# Patient Record
Sex: Female | Born: 1965 | Race: Black or African American | Hispanic: No | Marital: Married | Smoking: Never smoker
Health system: Southern US, Community
[De-identification: ages and names within clinical notes are randomized; demographics above are authoritative.]

## PROBLEM LIST (undated history)

## (undated) DIAGNOSIS — K219 Gastro-esophageal reflux disease without esophagitis: Secondary | ICD-10-CM

## (undated) DIAGNOSIS — I10 Essential (primary) hypertension: Secondary | ICD-10-CM

## (undated) DIAGNOSIS — Z87442 Personal history of urinary calculi: Secondary | ICD-10-CM

## (undated) HISTORY — PX: CYSTOSCOPY W/ STONE MANIPULATION: SHX1427

## (undated) HISTORY — PX: ANKLE FRACTURE SURGERY: SHX122

---

## 2018-02-13 ENCOUNTER — Other Ambulatory Visit: Payer: Self-pay

## 2018-02-13 ENCOUNTER — Encounter: Payer: Self-pay | Admitting: *Deleted

## 2018-02-13 ENCOUNTER — Emergency Department (INDEPENDENT_AMBULATORY_CARE_PROVIDER_SITE_OTHER)
Admission: EM | Admit: 2018-02-13 | Discharge: 2018-02-13 | Disposition: A | Discharge status: Home / Self Care | Payer: BLUE CROSS/BLUE SHIELD | Source: Home / Self Care | Attending: Family Medicine | Admitting: Family Medicine

## 2018-02-13 ENCOUNTER — Emergency Department (INDEPENDENT_AMBULATORY_CARE_PROVIDER_SITE_OTHER): Payer: Worker's Compensation

## 2018-02-13 DIAGNOSIS — M19071 Primary osteoarthritis, right ankle and foot: Secondary | ICD-10-CM

## 2018-02-13 DIAGNOSIS — S93401A Sprain of unspecified ligament of right ankle, initial encounter: Secondary | ICD-10-CM

## 2018-02-13 HISTORY — DX: Gastro-esophageal reflux disease without esophagitis: K21.9

## 2018-02-13 HISTORY — DX: Essential (primary) hypertension: I10

## 2018-02-13 MED ORDER — HYDROCODONE-ACETAMINOPHEN 5-325 MG PO TABS: 1.0000 | ORAL_TABLET | Freq: Four times a day (QID) | ORAL | 0 refills | Status: AC | PRN

## 2018-02-13 NOTE — Discharge Instructions (Addendum)
Apply ice pack for 30 minutes every 1 to 2 hours today and tomorrow.  Elevate.  Use crutches for 3 to 5 days.  Wear Ace wrap until swelling decreases.  Wear brace for about 2 to 3 weeks.  Begin range of motion and stretching exercises in about 5 days as per instruction sheet.  May take Ibuprofen 200mg, 4 tabs every 8 hours with food.  ° °

## 2018-02-13 NOTE — ED Triage Notes (Signed)
Pt c/o RT ankle pain x 0415 this morning after twisting it when she was getting out of the bed. She had a surgery on that in October 2017 for a workers comp injury. She took Advil, elevated and iced today.

## 2018-02-13 NOTE — ED Provider Notes (Signed)
[facility]    CSN: [id number] Arrival date & time: 02/13/18  1747     History   Chief Complaint Chief Complaint  Patient presents with  . Ankle Pain    HPI Sheila Zamora is a 52 y.o. female.   Patient states that she became briefly light-headed this morning, and twisted (inverted) her right ankle.  She has had persistent pain/swelling in her right ankle.  She has a past history of right ankle fracture in 2017 requiring ORIF.  The history is provided by the patient.  Ankle Pain  Location:  Ankle Time since incident:  14 hours Injury: yes   Mechanism of injury comment:  Inverted ankle Ankle location:  R ankle Pain details:    Quality:  Aching   Radiates to:  Does not radiate   Severity:  Moderate   Onset quality:  Sudden   Timing:  Constant   Progression:  Unchanged Chronicity:  New Prior injury to area:  Yes Relieved by:  Nothing Worsened by:  Bearing weight Ineffective treatments:  NSAIDs and ice Associated symptoms: decreased ROM, stiffness and swelling   Associated symptoms: no muscle weakness, no numbness and no tingling     Past Medical History:  Diagnosis Date  . GERD (gastroesophageal reflux disease)   . Hypertension     There are no active problems to display for this patient.   Past Surgical History:  Procedure Laterality Date  . ANKLE FRACTURE SURGERY Right     OB History   None      Home Medications    Prior to Admission medications   Medication Sig Start Date End Date Taking? Authorizing Provider  lisinopril (PRINIVIL,ZESTRIL) 30 MG tablet Take by mouth. 05/16/14  Yes [provider]  HYDROcodone-acetaminophen (NORCO/VICODIN) 5-325 MG tablet Take 1 tablet by mouth every 6 (six) hours as needed for up to 5 days for moderate pain. 02/13/18 02/18/18  Lattie Haw, MD  pantoprazole (PROTONIX) 40 MG tablet TAKE 1 TABLET (40 MG TOTAL) BY MOUTH DAILY. 01/23/18   [provider]    Family History Family History    Problem Relation Age of Onset  . Cancer Mother        ovarian  . Heart attack Father   . Stroke Father     Social History Social History   Tobacco Use  . Smoking status: Never Smoker  . Smokeless tobacco: Never Used  Substance Use Topics  . Alcohol use: Never    Frequency: Never  . Drug use: Never     Allergies   Patient has no known allergies.   Review of Systems Review of Systems  Musculoskeletal: Positive for stiffness.  All other systems reviewed and are negative.    Physical Exam Triage Vital Signs ED Triage Vitals  Enc Vitals Group     BP 02/13/18 1839 132/85     Pulse Rate 02/13/18 1839 74     Resp 02/13/18 1839 16     Temp 02/13/18 1839 97.6 F (36.4 C)     Temp Source 02/13/18 1839 Oral     SpO2 02/13/18 1839 100 %     Weight 02/13/18 1840 212 lb (96.2 kg)     Height 02/13/18 1840  (1.702 m)     Head Circumference --      Peak Flow --      Pain Score 02/13/18 1839 6     Pain Loc --      Pain Edu? --  Excl. in GC? --    No data found.  Updated Vital Signs BP 132/85 (BP Location: Right Arm)   Pulse 74   Temp 97.6 F (36.4 C) (Oral)   Resp 16   Ht  (1.702 m)   Wt 212 lb (96.2 kg)   SpO2 100%   BMI 33.20 kg/m   Visual Acuity Right Eye Distance:   Left Eye Distance:   Bilateral Distance:    Right Eye Near:   Left Eye Near:    Bilateral Near:     Physical Exam  Constitutional: She appears well-developed and well-nourished. No distress.  HENT:  Head: Atraumatic.  Eyes: Pupils are equal, round, and reactive to light.  Neck: Normal range of motion.  Cardiovascular: Normal rate.  Pulmonary/Chest: Effort normal.  Musculoskeletal:       Right ankle: She exhibits decreased range of motion and swelling. She exhibits no ecchymosis, no deformity, no laceration and normal pulse. Tenderness. Lateral malleolus tenderness found. No head of 5th metatarsal tenderness found. Achilles tendon normal.       Feet:  Right ankle:   Decreased range of motion.  Tenderness and swelling over the lateral malleolus and peroneal tendon.  Joint stable.  No tenderness over the base of the fifth metatarsal.  Distal neurovascular function is intact.   Neurological: She is alert.  Skin: Skin is warm and dry.  Nursing note and vitals reviewed.    UC Treatments / Results  Labs (all labs ordered are listed, but only abnormal results are displayed) Labs Reviewed - No data to display  EKG None  Radiology Dg Ankle Complete Right  Result Date: 02/13/2018 CLINICAL DATA:  52 year old female with right ankle pain since twisting her ankle while getting out of bed. EXAM: RIGHT ANKLE - COMPLETE 3+ VIEW COMPARISON:  None. FINDINGS: Mild soft tissue swelling about the ankle. Surgical changes of prior ORIF of a now healed fibular fracture with buttress plate and screw construct as well as ORIF of a posterior distal tibia fracture with a small malleable plate and screw construct. 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 No evidence of acute fracture, malalignment or hardware complication. IMPRESSION: 1. Mild soft tissue swelling surrounding the ankle without evidence of acute fracture, malalignment or hardware complication. 2. 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 3. Surgical changes of prior ORIF of healed distal fibular and distal posterior tibial fractures. Electronically Signed   By: Malachy Moan M.D.   On: 02/13/2018 19:05    Procedures Procedures (including critical care time)  Medications Ordered in UC Medications - No data to display  Initial Impression / Assessment and Plan / UC Course  I have reviewed the triage vital signs and the nursing notes.  Pertinent labs & imaging results that were available during my care of the patient were reviewed by me and considered in my medical decision making (see chart for details).    Ace wrap applied.  Dispensed AirCast stirrup  splint. Rx for Lortab. Controlled Substance Prescriptions I have consulted the [place] Controlled Substances Registry for this patient, and feel the risk/benefit ratio today is favorable for proceeding with this prescription for a controlled substance.  Because of her history of ORIF, recommend followup with orthopedist 7 to 10 days.  Final Clinical Impressions(s) / UC Diagnoses   Final diagnoses:  Sprain of right ankle, unspecified ligament, initial encounter     Discharge Instructions     Apply ice pack for 30 minutes every 1 to  2 hours today and tomorrow.  Elevate.  Use crutches for 3 to 5 days.  Wear Ace wrap until swelling decreases.  Wear brace for about 2 to 3 weeks.  Begin range of motion and stretching exercises in about 5 days as per instruction sheet.  May take Ibuprofen , 4 tabs every 8 hours with food.     ED Prescriptions    Medication Sig Dispense Auth. Provider   HYDROcodone-acetaminophen (NORCO/VICODIN) 5-325 MG tablet Take 1 tablet by mouth every 6 (six) hours as needed for up to 5 days for moderate pain. 12 tablet Lattie Haw, MD        Lattie Haw, MD 02/15/18 1044

## 2018-04-28 ENCOUNTER — Emergency Department (INDEPENDENT_AMBULATORY_CARE_PROVIDER_SITE_OTHER)
Admission: EM | Admit: 2018-04-28 | Discharge: 2018-04-28 | Disposition: A | Discharge status: Home / Self Care | Payer: BLUE CROSS/BLUE SHIELD | Source: Home / Self Care | Attending: Family Medicine | Admitting: Family Medicine

## 2018-04-28 ENCOUNTER — Other Ambulatory Visit: Payer: Self-pay

## 2018-04-28 DIAGNOSIS — H6123 Impacted cerumen, bilateral: Secondary | ICD-10-CM | POA: Diagnosis not present

## 2018-04-28 NOTE — ED Provider Notes (Signed)
[facility]    CSN: [id number] Arrival date & time: 04/28/18  1634     History   Chief Complaint Chief Complaint  Patient presents with  . Ear Fullness    HPI Sheila Zamora is a 52 y.o. female.   Patient reports that her ears have felt full for about a week, worse on the right.  She denies pain.  The history is provided by the patient.    Past Medical History:  Diagnosis Date  . GERD (gastroesophageal reflux disease)   . Hypertension     There are no active problems to display for this patient.   Past Surgical History:  Procedure Laterality Date  . ANKLE FRACTURE SURGERY Right     OB History   None      Home Medications    Prior to Admission medications   Medication Sig Start Date End Date Taking? Authorizing Provider  lisinopril (PRINIVIL,ZESTRIL) 30 MG tablet Take by mouth. 05/16/14   [provider]  pantoprazole (PROTONIX) 40 MG tablet TAKE 1 TABLET (40 MG TOTAL) BY MOUTH DAILY. 01/23/18   [provider]    Family History Family History  Problem Relation Age of Onset  . Cancer Mother        ovarian  . Heart attack Father   . Stroke Father     Social History Social History   Tobacco Use  . Smoking status: Never Smoker  . Smokeless tobacco: Never Used  Substance Use Topics  . Alcohol use: Never    Frequency: Never  . Drug use: Never     Allergies   Patient has no known allergies.   Review of Systems Review of Systems No sore throat No cough No pleuritic pain No wheezing No nasal congestion No post-nasal drainage No sinus pain/pressure No itchy/red eyes ? earache No hemoptysis No SOB No fever/chills No nausea No vomiting No abdominal pain No diarrhea No urinary symptoms No skin rash No fatigue No myalgias No headache    Physical Exam Triage Vital Signs ED Triage Vitals [04/28/18 1707]  Enc Vitals Group     BP (!) 145/82     Pulse Rate 83     Resp      Temp 98.6 F (37 C)     Temp  Source Oral     SpO2 100 %     Weight 208 lb (94.3 kg)     Height 5\' 7"  (1.702 m)     Head Circumference      Peak Flow      Pain Score 0     Pain Loc      Pain Edu?      Excl. in GC?    No data found.  Updated Vital Signs BP (!) 145/82 (BP Location: Right Arm)   Pulse 83   Temp 98.6 F (37 C) (Oral)   Ht 5\' 7"  (1.702 m)   Wt 208 lb (94.3 kg)   SpO2 100%   BMI 32.58 kg/m   Visual Acuity Right Eye Distance:   Left Eye Distance:   Bilateral Distance:    Right Eye Near:   Left Eye Near:    Bilateral Near:     Physical Exam Nursing notes and Vital Signs reviewed. Appearance:  Patient appears stated age, and in no acute distress Eyes:  Pupils are equal, round, and reactive to light and accomodation.  Extraocular movement is intact.  Conjunctivae are not inflamed  Ears:   Canals occluded with cerumen  bilaterally.  Post lavage, small amount cerumen remains in canals but tympanic membranes appear normal.    UC Treatments / Results  Labs (all labs ordered are listed, but only abnormal results are displayed) Labs Reviewed - No data to display  EKG None  Radiology No results found.  Procedures Procedures  Ear lavage by nurse  Medications Ordered in UC Medications - No data to display  Initial Impression / Assessment and Plan / UC Course  I have reviewed the triage vital signs and the nursing notes.  Pertinent labs & imaging results that were available during my care of the patient were reviewed by me and considered in my medical decision making (see chart for details).       Final Clinical Impressions(s) / UC Diagnoses   Final diagnoses:  Bilateral impacted cerumen     Discharge Instructions      To prevent recurrent ear wax blockage, try the following: Soak two cotton balls with mineral oil, and gently place in each ear canal once weekly.  Leave the cotton balls in place for 10 to 20 minutes.  This will help liquefy the ear wax and aid your body's  normal elimination process.  If applicable, do not use a hearing aid for 8 hours overnight.  Have your ears cleaned by a health professional every 6 to 12 months.  Avoid using "Q-tips" and ear wax softening solutions.     ED Prescriptions    None        Lattie HawBeese, Cayley Pester A, MD 04/28/18 1734

## 2018-04-28 NOTE — Discharge Instructions (Signed)
To prevent recurrent ear wax blockage, try the following: Soak two cotton balls with mineral oil, and gently place in each ear canal once weekly.  Leave the cotton balls in place for 10 to 20 minutes.  This will help liquefy the ear wax and aid your body's normal elimination process.  If applicable, do not use a hearing aid for 8 hours overnight.  Have your ears cleaned by a health professional every 6 to 12 months.  Avoid using "Q-tips" and ear wax softening solutions  

## 2018-04-28 NOTE — ED Triage Notes (Signed)
Pt stated that her ears have felt full all week, and hearing has decreased in the right.

## 2018-12-14 IMAGING — DX DG ANKLE COMPLETE 3+V*R*
3 series · 3 of 3 positions shown · non-contrast
Comparison: None.

CLINICAL DATA: 51-year-old female with right ankle pain since
twisting her ankle while getting out of bed.

EXAM:
RIGHT ANKLE - COMPLETE 3+ VIEW

[ankle ap]
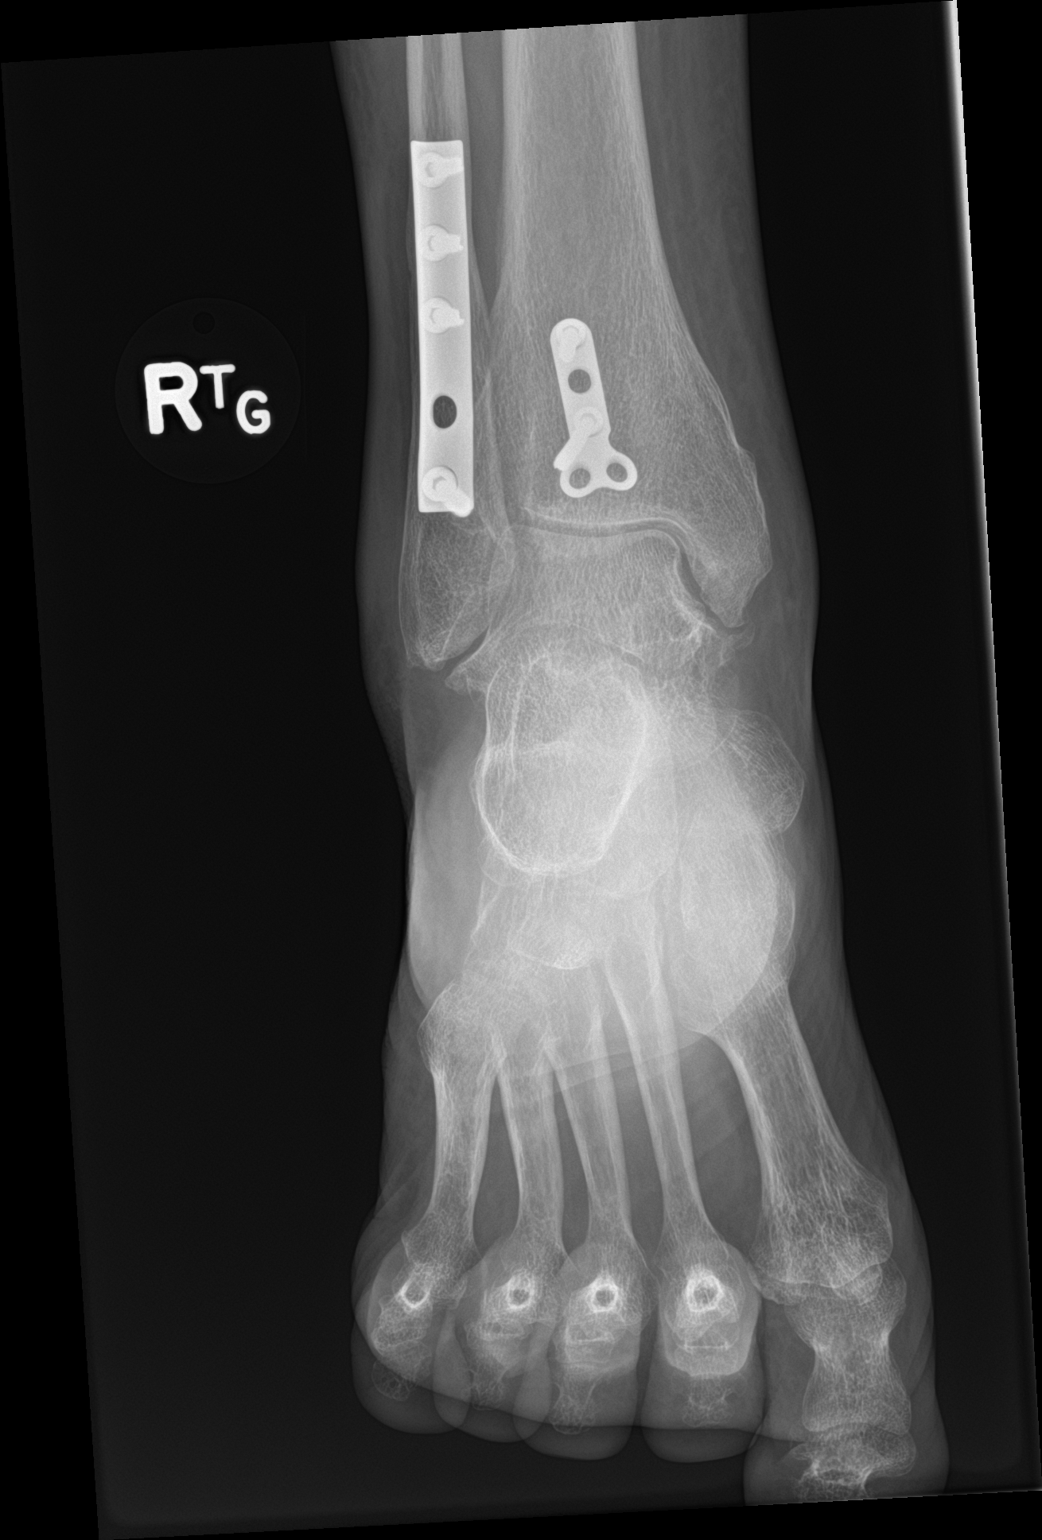

[ankle obl]
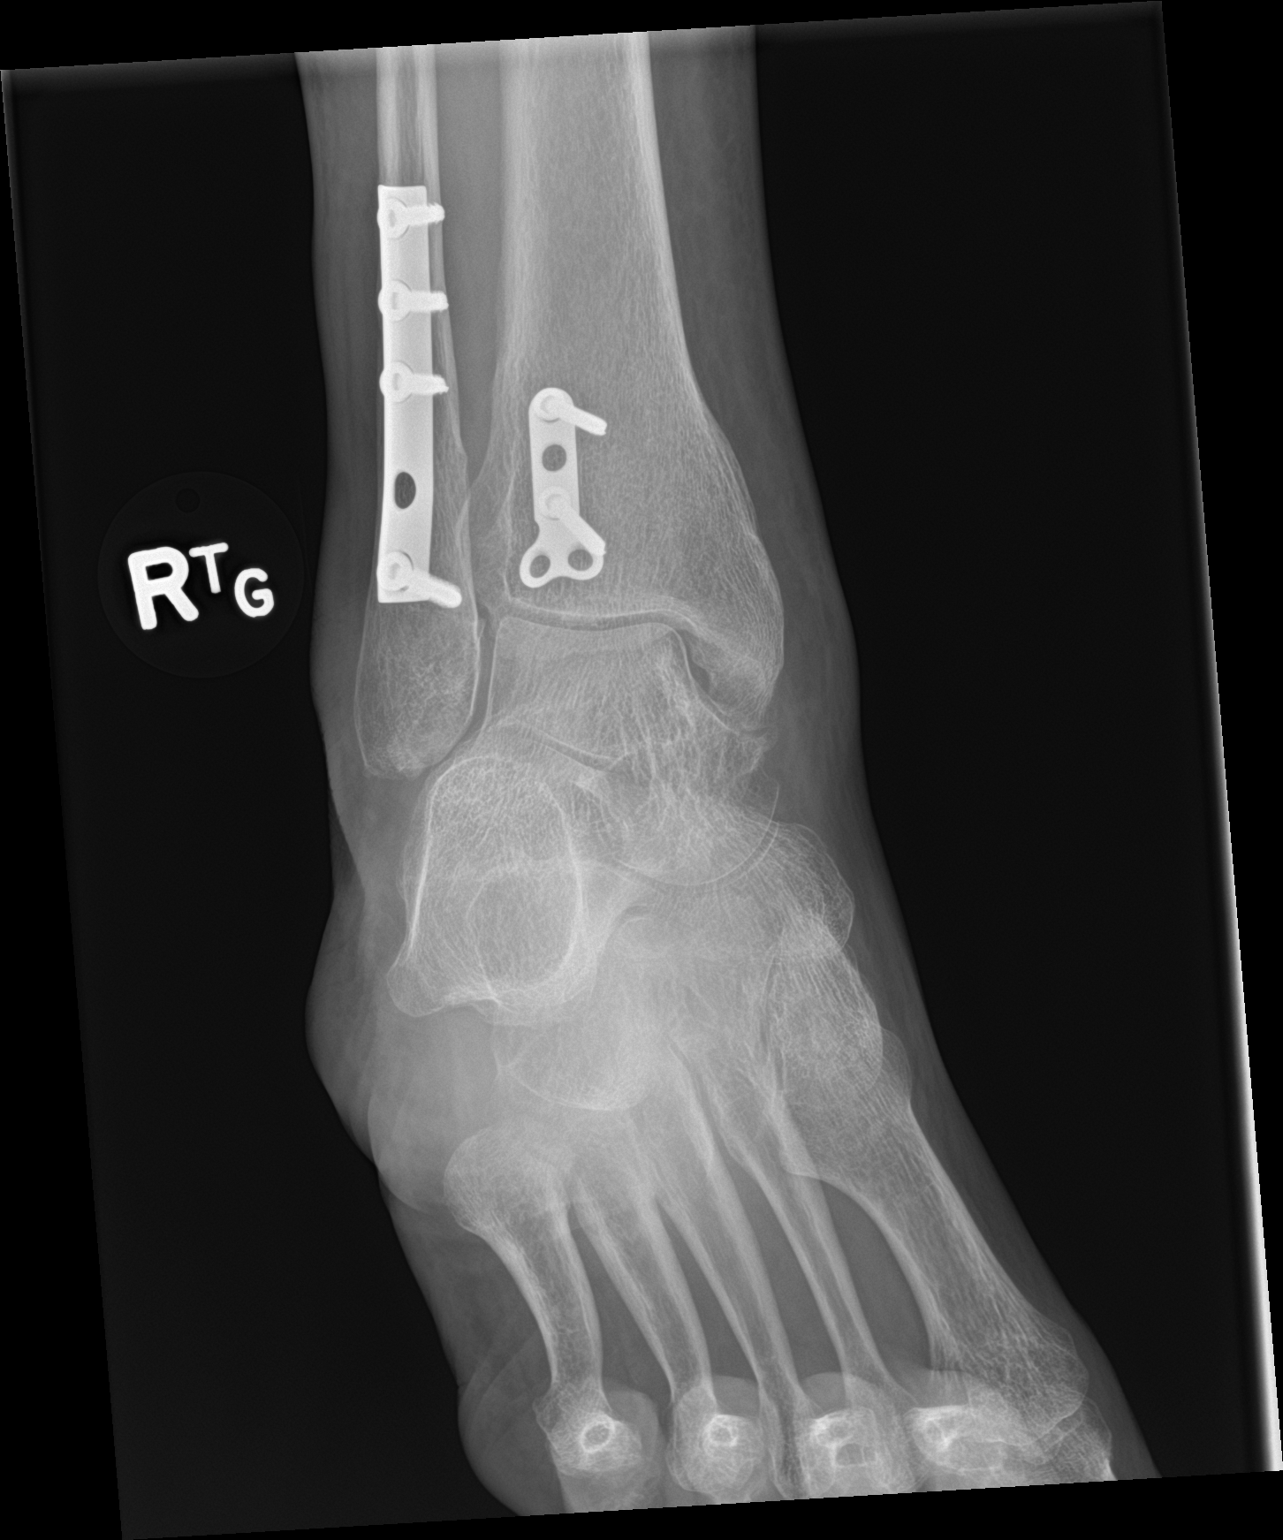

[ankle lat]
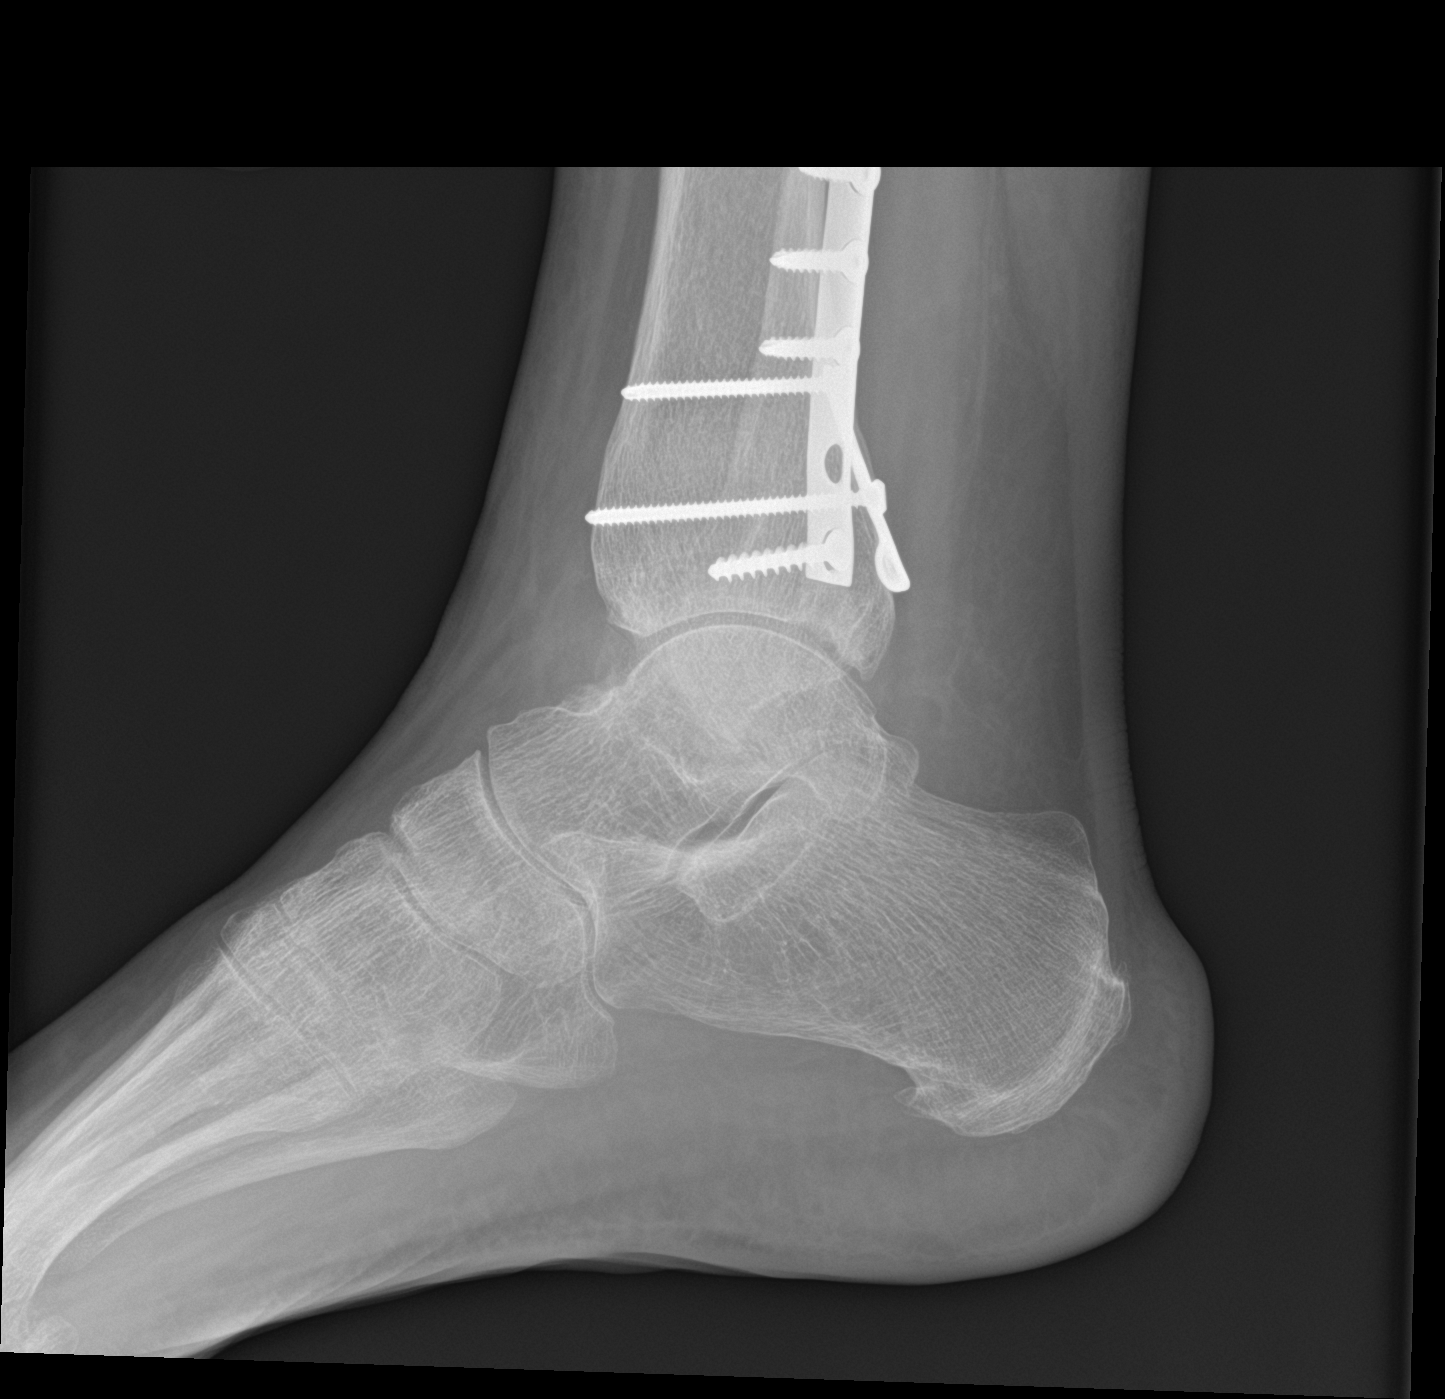

[3 of 3 positions shown; findings below may reference images not displayed]

FINDINGS: Mild soft tissue swelling about the ankle. Surgical changes of prior
ORIF of a now healed fibular fracture with buttress plate and screw
construct as well as ORIF of a posterior distal tibia fracture with
a small malleable plate and screw construct. Degenerative
osteoarthritis is present at the medial tibiotalar joint. No
evidence of acute fracture, malalignment or hardware complication.
IMPRESSION: 1. Mild soft tissue swelling surrounding the ankle without evidence
of acute fracture, malalignment or hardware complication.
2. Degenerative osteoarthritis at the medial tibiotalar joint.
3. Surgical changes of prior ORIF of healed distal fibular and
distal posterior tibial fractures.

## 2021-08-20 ENCOUNTER — Other Ambulatory Visit: Payer: Self-pay

## 2021-08-20 ENCOUNTER — Emergency Department
Admission: EM | Admit: 2021-08-20 | Discharge: 2021-08-20 | Disposition: A | Discharge status: Home / Self Care | Payer: BLUE CROSS/BLUE SHIELD | Source: Home / Self Care

## 2021-08-20 DIAGNOSIS — R52 Pain, unspecified: Secondary | ICD-10-CM | POA: Diagnosis not present

## 2021-08-20 LAB — POC INFLUENZA A AND B ANTIGEN (URGENT CARE ONLY)
Influenza A Ag: NEGATIVE
Influenza B Ag: NEGATIVE

## 2021-08-20 NOTE — Discharge Instructions (Addendum)
Advised/encouraged patient to take OTC Tylenol 1000 mg 1-2 times daily, as needed for fever and myalgias.  Encouraged patient increase daily water intake while taking this medication.  Work note provided prior to discharge per patient request

## 2021-08-20 NOTE — ED Provider Notes (Signed)
[facility]    CSN: [id number] Arrival date & time: 08/20/21  0817      History   Chief Complaint Chief Complaint  Patient presents with   Generalized Body Aches   Sore Throat    HPI Falisha Osment is a 55 y.o. female.   HPI 55 year old female presents with generalized body aches and sore throat for 1 to 2 days.  Patient reports negative COVID-19 home test this morning.  Past Medical History:  Diagnosis Date   GERD (gastroesophageal reflux disease)    Hypertension     There are no problems to display for this patient.   Past Surgical History:  Procedure Laterality Date   ANKLE FRACTURE SURGERY Right     OB History   No obstetric history on file.      Home Medications    Prior to Admission medications   Medication Sig Start Date End Date Taking? Authorizing Provider  sertraline (ZOLOFT) 50 MG tablet Take 1 tablet by mouth daily. 10/16/19  Yes [provider]  LINZESS 145 MCG CAPS capsule Take 145 mcg by mouth daily. 05/29/21   [provider]  lisinopril (PRINIVIL,ZESTRIL) 30 MG tablet Take by mouth. 05/16/14   [provider]  pantoprazole (PROTONIX) 40 MG tablet TAKE 1 TABLET (40 MG TOTAL) BY MOUTH DAILY. 01/23/18   [provider]    Family History Family History  Problem Relation Age of Onset   Cancer Mother        ovarian   Heart attack Father    Stroke Father     Social History Social History   Tobacco Use   Smoking status: Never   Smokeless tobacco: Never  Vaping Use   Vaping Use: Never used  Substance Use Topics   Alcohol use: Never   Drug use: Never     Allergies   Patient has no known allergies.   Review of Systems Review of Systems  HENT:  Positive for sore throat.   Musculoskeletal:  Positive for myalgias.  All other systems reviewed and are negative.   Physical Exam Triage Vital Signs ED Triage Vitals [08/20/21 0833]  Enc Vitals Group     BP (!) 151/85     Pulse Rate 79      Resp 17     Temp 98.3 F (36.8 C)     Temp Source Oral     SpO2 100 %     Weight      Height      Head Circumference      Peak Flow      Pain Score 2     Pain Loc      Pain Edu?      Excl. in GC?    No data found.  Updated Vital Signs BP (!) 151/85 (BP Location: Right Arm)   Pulse 79   Temp 98.3 F (36.8 C) (Oral)   Resp 17   SpO2 100%    Physical Exam Vitals and nursing note reviewed.  Constitutional:      General: She is not in acute distress.    Appearance: She is obese. She is not ill-appearing.  HENT:     Head: Normocephalic and atraumatic.     Right Ear: Tympanic membrane and ear canal normal.     Left Ear: Tympanic membrane and ear canal normal.     Mouth/Throat:     Mouth: Mucous membranes are moist.     Pharynx: Oropharynx is clear. Uvula midline.  Eyes:     Conjunctiva/sclera: Conjunctivae normal.     Pupils: Pupils are equal, round, and reactive to light.  Cardiovascular:     Rate and Rhythm: Normal rate and regular rhythm.     Heart sounds: Normal heart sounds.  Pulmonary:     Effort: Pulmonary effort is normal.     Breath sounds: Normal breath sounds.  Musculoskeletal:     Cervical back: Normal range of motion and neck supple.  Skin:    General: Skin is warm and dry.  Neurological:     General: No focal deficit present.     Mental Status: She is alert and oriented to person, place, and time.     UC Treatments / Results  Labs (all labs ordered are listed, but only abnormal results are displayed) Labs Reviewed  POC INFLUENZA A AND B ANTIGEN (URGENT CARE ONLY)    EKG   Radiology No results found.  Procedures Procedures (including critical care time)  Medications Ordered in UC Medications - No data to display  Initial Impression / Assessment and Plan / UC Course  I have reviewed the triage vital signs and the nursing notes.  Pertinent labs & imaging results that were available during my care of the patient were reviewed by me and  considered in my medical decision making (see chart for details).     MDM: 1.  Generalized body aches-rapid influenza negative. Advised/encouraged patient to take OTC Tylenol 1000 mg 1-2 times daily, as needed for fever and myalgias.  Encouraged patient increase daily water intake while taking this medication.  Work note provided prior to discharge per patient request Final Clinical Impressions(s) / UC Diagnoses   Final diagnoses:  Generalized body aches     Discharge Instructions      Advised/encouraged patient to take OTC Tylenol 1000 mg 1-2 times daily, as needed for fever and myalgias.  Encouraged patient increase daily water intake while taking this medication.  Work note provided prior to discharge per patient request     ED Prescriptions   None    PDMP not reviewed this encounter.   Trevor Iha, FNP 08/20/21 [phone]

## 2021-08-20 NOTE — ED Triage Notes (Signed)
Pt c/o bodyaches and sore throat since yesterday evening. Also had chills. Aleve this am. COVID test at home was neg this morning.

## 2022-04-16 ENCOUNTER — Emergency Department (INDEPENDENT_AMBULATORY_CARE_PROVIDER_SITE_OTHER): Payer: PRIVATE HEALTH INSURANCE

## 2022-04-16 ENCOUNTER — Encounter: Payer: Self-pay | Admitting: Emergency Medicine

## 2022-04-16 ENCOUNTER — Emergency Department
Admission: EM | Admit: 2022-04-16 | Discharge: 2022-04-16 | Disposition: A | Discharge status: Home / Self Care | Payer: PRIVATE HEALTH INSURANCE | Source: Home / Self Care | Attending: Family Medicine | Admitting: Family Medicine

## 2022-04-16 DIAGNOSIS — M545 Low back pain, unspecified: Secondary | ICD-10-CM

## 2022-04-16 DIAGNOSIS — R109 Unspecified abdominal pain: Secondary | ICD-10-CM

## 2022-04-16 DIAGNOSIS — M5416 Radiculopathy, lumbar region: Secondary | ICD-10-CM | POA: Diagnosis not present

## 2022-04-16 HISTORY — DX: Personal history of urinary calculi: Z87.442

## 2022-04-16 LAB — POCT URINALYSIS DIP (MANUAL ENTRY)
Bilirubin, UA: NEGATIVE
Glucose, UA: NEGATIVE mg/dL
Ketones, POC UA: NEGATIVE mg/dL
Leukocytes, UA: NEGATIVE
Nitrite, UA: NEGATIVE
Protein Ur, POC: NEGATIVE mg/dL
Spec Grav, UA: >=1.03 — AB (ref 1.010–1.025)
Urobilinogen, UA: 1 E.U./dL
pH, UA: 5.5 (ref 5.0–8.0)

## 2022-04-16 MED ORDER — PREDNISONE 20 MG PO TABS: ORAL_TABLET | ORAL | 0 refills | Status: AC

## 2022-04-16 NOTE — ED Provider Notes (Signed)
[facility]    CSN: [id number] Arrival date & time: 04/16/22  1805      History   Chief Complaint Chief Complaint  Patient presents with   Flank Pain    Right    HPI Sheila Zamora is a 56 y.o. female.   Patient complains or several month history of intermittent positional right back, flank, abdominal and thigh pain.  During the past 7 to 10 days the pain has become constant.  She has had a left kidney stone (requiring surgery) about 8 years ago and she is concerned that she may have a recurrent stone.  She denies hematuria or changes in urination.  She has had intermittent mild nausea without vomiting after eating for about a week.  She denies fevers, chills, and sweats and feels well otherwise. Past surgical history also of hysterectomy approximately 24 years ago and ORIF right ankle fracture.  The history is provided by the patient.  Flank Pain This is a recurrent problem. The current episode started more than 1 week ago. The problem occurs constantly. The problem has been gradually worsening. Associated symptoms include abdominal pain. Pertinent negatives include no chest pain. The symptoms are aggravated by walking and bending. Nothing relieves the symptoms. She has tried acetaminophen for the symptoms. The treatment provided mild relief.    Past Medical History:  Diagnosis Date   GERD (gastroesophageal reflux disease)    History of kidney stones    Hypertension     There are no problems to display for this patient.   Past Surgical History:  Procedure Laterality Date   ANKLE FRACTURE SURGERY Right    CYSTOSCOPY W/ STONE MANIPULATION     approx 2015    OB History   No obstetric history on file.      Home Medications    Prior to Admission medications   Medication Sig Start Date End Date Taking? Authorizing Provider  cyanocobalamin 100 MCG tablet Take 1 tablet by mouth daily. 09/25/21  Yes [provider]  predniSONE (DELTASONE) 20 MG tablet  Take one tab by mouth twice daily for 4 days, then one daily for 3 days. Take with food. 04/16/22  Yes Lattie Haw, MD  LINZESS 145 MCG CAPS capsule Take 145 mcg by mouth daily. 05/29/21   [provider]  lisinopril (PRINIVIL,ZESTRIL) 30 MG tablet Take by mouth. 05/16/14   [provider]  Melatonin 10 MG TABS Take by mouth.    [provider]  pantoprazole (PROTONIX) 40 MG tablet TAKE 1 TABLET (40 MG TOTAL) BY MOUTH DAILY. 01/23/18   [provider]  sertraline (ZOLOFT) 50 MG tablet Take 1 tablet by mouth daily. 10/16/19   [provider]    Family History Family History  Problem Relation Age of Onset   Cancer Mother        ovarian   Heart attack Father    Stroke Father     Social History Social History   Tobacco Use   Smoking status: Never   Smokeless tobacco: Never  Vaping Use   Vaping Use: Never used  Substance Use Topics   Alcohol use: Never   Drug use: Never     Allergies   Patient has no known allergies.   Review of Systems Review of Systems  Constitutional:  Positive for activity change and appetite change. Negative for chills, diaphoresis, fatigue, fever and unexpected weight change.  HENT: Negative.    Eyes: Negative.   Respiratory: Negative.  Cardiovascular:  Negative for chest pain.  Gastrointestinal:  Positive for abdominal pain and nausea. Negative for abdominal distention, blood in stool, constipation, diarrhea and vomiting.       She noted dark stools during the past 3 days, normal today  Genitourinary:  Positive for flank pain. Negative for dysuria, frequency, hematuria and urgency.  Skin: Negative.   Neurological: Negative.      Physical Exam Triage Vital Signs ED Triage Vitals  Enc Vitals Group     BP 04/16/22 1819 133/89     Pulse Rate 04/16/22 1819 86     Resp 04/16/22 1819 18     Temp 04/16/22 1819 99.1 F (37.3 C)     Temp Source 04/16/22 1819 Oral     SpO2 04/16/22 1819 100 %     Weight  04/16/22 1822 213 lb (96.6 kg)     Height 04/16/22 1822 5\' 7"  (1.702 m)     Head Circumference --      Peak Flow --      Pain Score 04/16/22 1821 3     Pain Loc --      Pain Edu? --      Excl. in GC? --    No data found.  Updated Vital Signs BP 133/89 (BP Location: Left Arm)   Pulse 86   Temp 99.1 F (37.3 C) (Oral)   Resp 18   Ht 5\' 7"  (1.702 m)   Wt 96.6 kg   SpO2 100%   BMI 33.36 kg/m   Visual Acuity Right Eye Distance:   Left Eye Distance:   Bilateral Distance:    Right Eye Near:   Left Eye Near:    Bilateral Near:     Physical Exam Vitals and nursing note reviewed.  Constitutional:      General: She is not in acute distress.    Appearance: She is obese. She is not ill-appearing.  HENT:     Head: Normocephalic.     Mouth/Throat:     Mouth: Mucous membranes are moist.     Pharynx: Oropharynx is clear.  Eyes:     Conjunctiva/sclera: Conjunctivae normal.     Pupils: Pupils are equal, round, and reactive to light.  Cardiovascular:     Rate and Rhythm: Normal rate and regular rhythm.     Heart sounds: Normal heart sounds.  Pulmonary:     Breath sounds: Normal breath sounds.  Abdominal:     General: Bowel sounds are normal.     Palpations: Abdomen is soft. There is no hepatomegaly or splenomegaly.     Tenderness: There is no guarding.       Comments: There is mild tenderness to palpation right lateral abdomen, but tenderness is predominantly over the right hip musculature.  Musculoskeletal:     Cervical back: Neck supple.       Back:     Right lower leg: No edema.     Left lower leg: Edema present.     Comments: Lumbar back has distinct midline tenderness to palpation as noted on diagram.  Tenderness extends to the right paraspinous lumbar muscles and down right lateral hip area.  Sitting knee extension test positive on right at about 20 degrees from horizontal. Right patellar reflex hyperreflexic. Right achilles reflex normal. Sensation/strength intact  right lower extremitiy.    Lymphadenopathy:     Cervical: No cervical adenopathy.  Skin:    General: Skin is warm and dry.     Findings: No rash.  Neurological:  Mental Status: She is alert and oriented to person, place, and time.      UC Treatments / Results  Labs (all labs ordered are listed, but only abnormal results are displayed) Labs Reviewed  POCT URINALYSIS DIP (MANUAL ENTRY) - Abnormal; Notable for the following components:      Result Value   Clarity, UA cloudy (*)    Spec Grav, UA >=1.030 (*)    Blood, UA trace-lysed (*)    All other components within normal limits    EKG   Radiology DG Lumbar Spine Complete  Result Date: 04/16/2022 CLINICAL DATA:  Right lower back pain radiating to right buttock and thigh. Right flank pain. EXAM: LUMBAR SPINE - COMPLETE 4+ VIEW COMPARISON:  None Available. FINDINGS: Five non-rib-bearing lumbar vertebra. Slight broad-based levo scoliotic curvature. Trace anterolisthesis of L4 on L5. Normal vertebral body heights. Slight L4-L5 disc space narrowing. Mild L5-S1 facet hypertrophy. No evidence of fracture, focal bone lesion or bone destruction. The sacroiliac joints are congruent. IMPRESSION: 1. Trace anterolisthesis of L4 on L5 with associated disc space narrowing. 2. Mild broad-based levo scoliotic curvature. 3. Mild L5-S1 facet hypertrophy. Electronically Signed   By: Narda Rutherford M.D.   On: 04/16/2022 19:36    Procedures Procedures (including critical care time)  Medications Ordered in UC Medications - No data to display  Initial Impression / Assessment and Plan / UC Course  I have reviewed the triage vital signs and the nursing notes.  Pertinent labs & imaging results that were available during my care of the patient were reviewed by me and considered in my medical decision making (see chart for details).    Exam and urinalysis not consistent with acute renal colic.  Suspect right lumbar radiculopathy.  Note mild lumbar  levo scoliosis on x-ray that would amplify any right nerve impingement. Begin prednisone burst/taper. Followup with Sports Medicine for further evaluation and treatment.  Final Clinical Impressions(s) / UC Diagnoses   Final diagnoses:  Lumbar radiculopathy, chronic     Discharge Instructions       Put ice on the affected area. To do this: Put ice in a plastic bag. Place a towel between your skin and the bag. Leave the ice on for 20 minutes, 2-3 times a day. Remove the ice if your skin turns bright red. This is very important. If you cannot feel pain, heat, or cold, you have a greater risk of damage to the area. May take Tylenol 1000mg , once or twice daily as needed for pain.     ED Prescriptions     Medication Sig Dispense Auth. Provider   predniSONE (DELTASONE) 20 MG tablet Take one tab by mouth twice daily for 4 days, then one daily for 3 days. Take with food. 11 tablet , MD         Lattie Haw, MD 04/17/22 [phone]

## 2022-04-16 NOTE — ED Triage Notes (Addendum)
R flank pain radiates around to right groin Pain has been constant x 10 days Tylenol has helped a little bit  Hx of kidney stones w/ surgery in past  Dark stool x 3 days - resolved  Pt has had blood infusions in the past for low counts Pt is not as fatigued as she has been in the past when she needed a blood transfusion  Denies dysuria

## 2022-04-16 NOTE — Discharge Instructions (Signed)
Put ice on the affected area. To do this: Put ice in a plastic bag. Place a towel between your skin and the bag. Leave the ice on for 20 minutes, 2-3 times a day. Remove the ice if your skin turns bright red. This is very important. If you cannot feel pain, heat, or cold, you have a greater risk of damage to the area. May take Tylenol 1000mg , once or twice daily as needed for pain.
